# Patient Record
Sex: Female | Born: 1990
Health system: Southern US, Community
[De-identification: ages and names within clinical notes are randomized; demographics above are authoritative.]

---

## 2018-12-29 ENCOUNTER — Other Ambulatory Visit: Payer: Self-pay

## 2018-12-29 ENCOUNTER — Ambulatory Visit (INDEPENDENT_AMBULATORY_CARE_PROVIDER_SITE_OTHER): Payer: Managed Care, Other (non HMO)

## 2018-12-29 ENCOUNTER — Encounter: Payer: Self-pay | Admitting: Orthopedic Surgery

## 2018-12-29 ENCOUNTER — Ambulatory Visit (INDEPENDENT_AMBULATORY_CARE_PROVIDER_SITE_OTHER): Payer: Managed Care, Other (non HMO) | Admitting: Orthopedic Surgery

## 2018-12-29 DIAGNOSIS — M25561 Pain in right knee: Secondary | ICD-10-CM | POA: Diagnosis not present

## 2018-12-29 NOTE — Progress Notes (Signed)
Office Visit Note   Patient: Susan Valenzuela           Date of Birth: [birth year]           MRN: [id number] Visit Date: 12/29/2018 Requested by: No referring provider defined for this encounter. PCP: Patient, No Pcp Per  Subjective: Chief Complaint  Patient presents with  . Right Knee - Pain    HPI: Susan Valenzuela is a 28 year old patient with right knee pain.  She twisted it several weeks ago and has had pain since that time.  She initially had same type of pain about 5 years ago.  Work-up at that time was negative.  Therapy helped her.  At this time she reports significant pain and mechanical symptoms along with swelling in the right knee.  She has been taking ibuprofen and ice without relief.  It has been worsening over the last 2 weeks.  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  She does like to play tennis and do yoga.  It does ache when she is trying to sleep at night.  She is tried sleeping with a pillow between her legs without much relief.  Does have a history of right hip surgery which she did well with.              ROS: All systems reviewed are negative as they relate to the chief complaint within the history of present illness.  Patient denies  fevers or chills.   Assessment & Plan: Visit Diagnoses:  1. Right knee pain, unspecified chronicity     Plan: Impression is right knee twisting injury with possible MCL sprain versus medial meniscal tear.  She is fairly debilitated by the injury.  Plan MRI scan to evaluate for MCL strain versus medial meniscal tear.  Patient has a high level of fitness and excellent muscle tone and thus I do not think therapy is really indicated at this time based on these factors.  Patient did have an injury and is having mechanical and localizing pain.  I will see her back after that study  Follow-Up Instructions: Return for after MRI.   Orders:  Orders Placed This Encounter  Procedures  . XR KNEE 3 VIEW RIGHT  . MR Knee Right w/o contrast   No  orders of the defined types were placed in this encounter.     Procedures: No procedures performed   Clinical Data: No additional findings.  Objective: Vital Signs: There were no vitals taken for this visit.  Physical Exam:   Constitutional: Patient appears well-developed HEENT:  Head: Normocephalic Eyes:EOM are normal Neck: Normal range of motion Cardiovascular: Normal rate Pulmonary/chest: Effort normal Neurologic: Patient is alert Skin: Skin is warm Psychiatric: Patient has normal mood and affect    Ortho Exam: Ortho exam demonstrates full active and passive range of motion of that right knee.  Patient has medial joint line tenderness but negative patellar apprehension.  No groin pain with internal X rotation of the leg.  Excellent muscle tone in both legs.  Pedal pulses palpable.  No other masses lymphadenopathy or skin changes noted in that right knee region  Specialty Comments:  No specialty comments available.  Imaging: Xr Knee 3 View Right  Result Date: 12/29/2018 AP lateral right knee reviewed.  Alignment normal.  No arthritis is present.  No acute fracture.  Normal right knee    PMFS History: There are no active problems to display for this patient.  History reviewed. No pertinent past medical  history.  History reviewed. No pertinent family history.  History reviewed. No pertinent surgical history. Social History   Occupational History  . Not on file  Tobacco Use  . Smoking status: Not on file  Substance and Sexual Activity  . Alcohol use: Not on file  . Drug use: Not on file  . Sexual activity: Not on file

## 2019-01-07 ENCOUNTER — Encounter: Payer: Self-pay | Admitting: Orthopedic Surgery

## 2019-01-08 ENCOUNTER — Telehealth: Payer: Self-pay | Admitting: *Deleted

## 2019-01-08 NOTE — Telephone Encounter (Signed)
I called pt and let her know she may have been potentially exposed to an employee who later tested positive for the COVID-19 at [facility] during her recent visit.  She wants to think about if she wants to be tested or not.  I gave her the number to call back if she decides to have it done.   [phone].

## 2019-01-10 ENCOUNTER — Ambulatory Visit
Admission: RE | Admit: 2019-01-10 | Discharge: 2019-01-10 | Disposition: A | Discharge status: Home / Self Care | Payer: Managed Care, Other (non HMO) | Source: Ambulatory Visit | Attending: Orthopedic Surgery | Admitting: Orthopedic Surgery

## 2019-01-10 ENCOUNTER — Other Ambulatory Visit: Payer: Self-pay

## 2019-01-10 DIAGNOSIS — M25561 Pain in right knee: Secondary | ICD-10-CM

## 2019-01-13 NOTE — Telephone Encounter (Signed)
I called her.  She does not need follow-up.  She has essentially normal MRI scan of her knee.  We discussed putting an injection in the knee.  Nothing operative in the knee.  Could be referred pain from the hip.  No hip symptoms however.  I think she should come in if she wants potentially quicker resolution to her symptoms.  She will follow-up as needed.  Please cancel future appointments.  Thanks

## 2019-05-04 DIAGNOSIS — Z20828 Contact with and (suspected) exposure to other viral communicable diseases: Secondary | ICD-10-CM | POA: Diagnosis not present

## 2019-05-30 DIAGNOSIS — Z20828 Contact with and (suspected) exposure to other viral communicable diseases: Secondary | ICD-10-CM | POA: Diagnosis not present

## 2019-06-16 DIAGNOSIS — N898 Other specified noninflammatory disorders of vagina: Secondary | ICD-10-CM | POA: Diagnosis not present

## 2019-06-16 DIAGNOSIS — R102 Pelvic and perineal pain: Secondary | ICD-10-CM | POA: Diagnosis not present

## 2019-07-08 DIAGNOSIS — Z20828 Contact with and (suspected) exposure to other viral communicable diseases: Secondary | ICD-10-CM | POA: Diagnosis not present

## 2019-07-25 DIAGNOSIS — G8929 Other chronic pain: Secondary | ICD-10-CM | POA: Diagnosis not present

## 2019-07-25 DIAGNOSIS — J3089 Other allergic rhinitis: Secondary | ICD-10-CM | POA: Diagnosis not present

## 2019-07-25 DIAGNOSIS — R519 Headache, unspecified: Secondary | ICD-10-CM | POA: Diagnosis not present

## 2019-08-01 DIAGNOSIS — Z20828 Contact with and (suspected) exposure to other viral communicable diseases: Secondary | ICD-10-CM | POA: Diagnosis not present

## 2020-01-25 DIAGNOSIS — Z03818 Encounter for observation for suspected exposure to other biological agents ruled out: Secondary | ICD-10-CM | POA: Diagnosis not present

## 2020-01-25 DIAGNOSIS — Z20822 Contact with and (suspected) exposure to covid-19: Secondary | ICD-10-CM | POA: Diagnosis not present

## 2020-04-03 ENCOUNTER — Ambulatory Visit (INDEPENDENT_AMBULATORY_CARE_PROVIDER_SITE_OTHER): Payer: BC Managed Care – PPO | Admitting: Surgical

## 2020-04-03 ENCOUNTER — Encounter: Payer: Self-pay | Admitting: Surgical

## 2020-04-03 DIAGNOSIS — M7051 Other bursitis of knee, right knee: Secondary | ICD-10-CM | POA: Diagnosis not present

## 2020-04-03 DIAGNOSIS — M7050 Other bursitis of knee, unspecified knee: Secondary | ICD-10-CM

## 2020-04-03 MED ORDER — METHYLPREDNISOLONE ACETATE 40 MG/ML IJ SUSP
40.0000 mg | INTRAMUSCULAR | Status: AC | PRN
  Administered 2020-04-03: 40 mg via INTRA_ARTICULAR

## 2020-04-03 MED ORDER — BUPIVACAINE HCL 0.25 % IJ SOLN
4.0000 mL | INTRAMUSCULAR | Status: AC | PRN
  Administered 2020-04-03: 4 mL via INTRA_ARTICULAR

## 2020-04-03 MED ORDER — LIDOCAINE HCL 1 % IJ SOLN
5.0000 mL | INTRAMUSCULAR | Status: AC | PRN
  Administered 2020-04-03: 5 mL

## 2020-04-03 NOTE — Progress Notes (Signed)
Office Visit Note   Patient: Susan Valenzuela           Date of Birth: [birth year]           MRN: [id number] Visit Date: 04/03/2020 Requested by: No referring provider defined for this encounter. PCP: Patient, No Pcp Per  Subjective: Chief Complaint  Patient presents with  . Right Knee - Pain    HPI: Susan Valenzuela is a 29 y.o. female who presents to the office complaining of right knee pain.  Patient notes constant pain for the last year and on and off pain over the last 3 years.  She localizes pain to the medial aspect of her right knee.  She notes that pain is been progressively worsening.  She denies any recent injury.  She states that the right knee feels weak and like it wants to give way at times but denies any frank instability or episodes of locking.  She does note that it is difficult to get to sleep and pain wakes her up at night on occasion as it is difficult to sleep on her side and let her knees rest on top of each other.  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  She does note occasional groin pain but denies any significant low back pain.  She has a history of a prior right hip procedure that was done 8 years ago in [place] where she "had a torn ligament that was repaired and a bone spur that was shaved".  She denies any other surgeries.  She is not taking any medication for her symptoms aside from occasional ibuprofen that provides minimal relief.  She has no recent knee or hip injections.  For exercise she likes to play tennis and bicycle but occasionally she has to take days off due to the medial knee pain..                ROS: All systems reviewed are negative as they relate to the chief complaint within the history of present illness.  Patient denies fevers or chills.  Assessment & Plan: Visit Diagnoses:  1. Pes anserine bursitis     Plan: Patient is a 29 year old female who presents complaining of right knee pain.  She localizes pain to the medial aspect of  the right knee.  She has had a recent right knee MRI scan earlier this year that was negative for any identifiable pathology to cause her pain.  However today, she has small amount of swelling and maximal tenderness over the pes anserine bursa.  Impression is that this is the source of her pain.  Make sense with pain that is worse with pressing the knees together at this location when she is trying to sleep at night as well as tenderness on exam and pain with resisted hamstring strength testing.  Discussed options available to patient, she wishes to proceed with injection.  Under ultrasound guidance the hamstring tendons were identified and a cortisone injection was delivered deep to these tendons at the area of maximal tenderness.  After administration of the injection, patient states that she had less tenderness on exam as well as less pain with resisted hamstring exercises.  Plan for patient to follow-up in 4 weeks for clinical recheck.  Follow-Up Instructions: No follow-ups on file.   Orders:  No orders of the defined types were placed in this encounter.  No orders of the defined types were placed in this encounter.     Procedures: Large  Joint Inj: R knee (Pes anserine bursa) on 04/03/2020 7:05 PM Indications: diagnostic evaluation, joint swelling and pain Details: 18 G 1.5 in needle, ultrasound-guided  Arthrogram: No  Medications: 5 mL lidocaine 1 %; 40 mg methylPREDNISolone acetate 40 MG/ML; 4 mL bupivacaine 0.25 % Outcome: tolerated well, no immediate complications Procedure, treatment alternatives, risks and benefits explained, specific risks discussed. Consent was given by the patient. Immediately prior to procedure a time out was called to verify the correct patient, procedure, equipment, support staff and site/side marked as required. Patient was prepped and draped in the usual sterile fashion.       Clinical Data: No additional findings.  Objective: Vital Signs: There were no  vitals taken for this visit.  Physical Exam:  Constitutional: Patient appears well-developed HEENT:  Head: Normocephalic Eyes:EOM are normal Neck: Normal range of motion Cardiovascular: Normal rate Pulmonary/chest: Effort normal Neurologic: Patient is alert Skin: Skin is warm Psychiatric: Patient has normal mood and affect  Ortho Exam: Ortho exam demonstrates right knee with 0 degrees of extension and greater than 120 degrees of flexion.  No ligamentous laxity with valgus/varus stress or with Lachman exam.  Mild to moderate tenderness over the medial joint line.  No tenderness over the lateral joint line, patellar tendon, patella, quadriceps tendon.  Maximal tenderness overlying the pes anserine bursa with associated swelling that is increased compared with the contralateral side.  No symmetric tenderness on the left knee.  Pain is elicited at this location with resisted hamstring strength testing.  No significant pain elicited with hip range of motion.  No tenderness over the trochanteric bursa.  Negative Stinchfield exam.  Negative straight leg raise.  Specialty Comments:  No specialty comments available.  Imaging: No results found.   PMFS History: There are no problems to display for this patient.  No past medical history on file.  No family history on file.  No past surgical history on file. Social History   Occupational History  . Not on file  Tobacco Use  . Smoking status: Never Smoker  . Smokeless tobacco: Never Used  Substance and Sexual Activity  . Alcohol use: Not on file  . Drug use: Not on file  . Sexual activity: Not on file

## 2020-04-27 ENCOUNTER — Encounter: Payer: Self-pay | Admitting: Orthopedic Surgery

## 2020-04-27 NOTE — Telephone Encounter (Signed)
Keep appointment on 9/22, will determine next steps after talking to her and examining her on that date

## 2020-05-01 ENCOUNTER — Ambulatory Visit: Payer: BC Managed Care – PPO | Admitting: Orthopaedic Surgery

## 2020-05-01 DIAGNOSIS — Z20822 Contact with and (suspected) exposure to covid-19: Secondary | ICD-10-CM | POA: Diagnosis not present

## 2020-05-02 ENCOUNTER — Ambulatory Visit: Payer: Self-pay

## 2020-05-02 ENCOUNTER — Encounter: Payer: Self-pay | Admitting: Orthopedic Surgery

## 2020-05-02 ENCOUNTER — Ambulatory Visit (INDEPENDENT_AMBULATORY_CARE_PROVIDER_SITE_OTHER): Payer: BC Managed Care – PPO | Admitting: Orthopedic Surgery

## 2020-05-02 DIAGNOSIS — M79604 Pain in right leg: Secondary | ICD-10-CM

## 2020-05-02 DIAGNOSIS — M25561 Pain in right knee: Secondary | ICD-10-CM

## 2020-05-02 NOTE — Progress Notes (Signed)
Office Visit Note   Patient: Susan Valenzuela           Date of Birth: 02/18/1991           MRN: [id number] Visit Date: 05/02/2020 Requested by: No referring provider defined for this encounter. PCP: Patient, No Pcp Per  Subjective: Chief Complaint  Patient presents with  . Right Knee - Pain    HPI: Susan Valenzuela is a active and fit 29 year old female with right knee pain.  She had a pes bursa injection with Franky Macho 04/03/2020.  She obtained no relief from that and actually had 3 days of fairly severe pain.  Reports generally constant pain on the medial aspect of her knee.  The pain will wake her from sleep occasionally.  She is unable to exercise.  States her pain is actually worse with exercise.  She has not been able to play tennis or walk for exercise.  Does give her pain every day.  She also reports some pain in the right hip anterior superior iliac crest region.  Denies much in the way of groin pain.  Has had right hip surgery in the past.  Takes ibuprofen for symptoms.  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  She would like to hike but she cannot really hike.  Denies any mechanical symptoms in the hip or knee.  She has tried some exercises for the hip but with no relief in terms of stretching.  Denies any back pain.              ROS: All systems reviewed are negative as they relate to the chief complaint within the history of present illness.  Patient denies  fevers or chills.   Assessment & Plan: Visit Diagnoses:  1. Pain in right leg   2. Right knee pain, unspecified chronicity     Plan: Impression is right hip and knee pain.  The knee pain is slightly atypical.  Had normal MRI scan over a year ago.  Could be a stress reaction or stress fracture not visible on plain radiographs.  Intra-articular pathology possible but less likely.  She is not interested in any type of injection at this time which is understandable based on her reaction to the last injection.  I think she may have hip  pathology radiating to the knee or some other type of intrinsic knee pathology which could become more visible on advanced imaging as it is more symptomatic now.  Plan is for MRI right knee to evaluate stress fracture proximal medial tibia as well as MRI scan right hip to evaluate possible recurrent labral pathology in the hip with history of prior arthroscopy.  Follow-Up Instructions: Return for after MRI.   Orders:  Orders Placed This Encounter  Procedures  . XR HIP UNILAT W OR W/O PELVIS 2-3 VIEWS RIGHT  . MR Knee Right w/o contrast  . MR Hip Right w/o contrast   No orders of the defined types were placed in this encounter.     Procedures: No procedures performed   Clinical Data: No additional findings.  Objective: Vital Signs: There were no vitals taken for this visit.  Physical Exam:   Constitutional: Patient appears well-developed HEENT:  Head: Normocephalic Eyes:EOM are normal Neck: Normal range of motion Cardiovascular: Normal rate Pulmonary/chest: Effort normal Neurologic: Patient is alert Skin: Skin is warm Psychiatric: Patient has normal mood and affect    Ortho Exam: Ortho exam demonstrates normal gait alignment.  No Trendelenburg gait.  Some  pain in the anterior superior iliac crest region but no groin pain with internal extra rotation of the leg.  Not much in terms of mechanical symptoms in the hip with passive or active range of motion.  Right knee demonstrates both medial joint line tenderness as well as tenderness over the pes bursa tendons.  No lateral tenderness.  Range of motion is full.  Collateral and cruciate ligaments are stable.  No other masses lymphadenopathy or skin changes noted in the right knee region.  Specialty Comments:  No specialty comments available.  Imaging: XR HIP UNILAT W OR W/O PELVIS 2-3 VIEWS RIGHT  Result Date: 05/02/2020 AP pelvis lateral right hip reviewed.  No arthritis is present.  Joint is located.  No enthesopathic  changes around the trochanters.  Hips otherwise normal.  Bony pelvis normal.    PMFS History: There are no problems to display for this patient.  History reviewed. No pertinent past medical history.  History reviewed. No pertinent family history.  History reviewed. No pertinent surgical history. Social History   Occupational History  . Not on file  Tobacco Use  . Smoking status: Never Smoker  . Smokeless tobacco: Never Used  Substance and Sexual Activity  . Alcohol use: Not on file  . Drug use: Not on file  . Sexual activity: Not on file

## 2020-05-14 ENCOUNTER — Other Ambulatory Visit: Payer: Self-pay | Admitting: Orthopedic Surgery

## 2020-05-20 ENCOUNTER — Other Ambulatory Visit: Payer: BC Managed Care – PPO

## 2020-05-21 NOTE — Telephone Encounter (Signed)
Just the ones in the note thx

## 2020-05-23 ENCOUNTER — Ambulatory Visit: Payer: BC Managed Care – PPO | Admitting: Orthopedic Surgery

## 2020-06-04 DIAGNOSIS — J019 Acute sinusitis, unspecified: Secondary | ICD-10-CM | POA: Diagnosis not present

## 2020-06-04 DIAGNOSIS — R059 Cough, unspecified: Secondary | ICD-10-CM | POA: Diagnosis not present

## 2020-06-04 DIAGNOSIS — J22 Unspecified acute lower respiratory infection: Secondary | ICD-10-CM | POA: Diagnosis not present

## 2020-06-05 DIAGNOSIS — Z20822 Contact with and (suspected) exposure to covid-19: Secondary | ICD-10-CM | POA: Diagnosis not present

## 2020-06-06 NOTE — Telephone Encounter (Signed)
Patient has worsening of symptoms with physician directed physical therapy and rehabilitative exercises.  Please order MRI scans of the hip and knee as requested before.  Thank you

## 2020-06-14 DIAGNOSIS — R0789 Other chest pain: Secondary | ICD-10-CM | POA: Diagnosis not present

## 2020-06-14 DIAGNOSIS — R059 Cough, unspecified: Secondary | ICD-10-CM | POA: Diagnosis not present

## 2020-07-02 DIAGNOSIS — Z20822 Contact with and (suspected) exposure to covid-19: Secondary | ICD-10-CM | POA: Diagnosis not present

## 2020-07-03 IMAGING — MR MRI OF THE RIGHT KNEE WITHOUT CONTRAST
5 of 8 series · 19 of 40 positions shown · non-contrast
Comparison: MRI 10/17/2014

CLINICAL DATA: Twisting injury 4 weeks ago.  Persistent knee pain.

EXAM:
MRI OF THE RIGHT KNEE WITHOUT CONTRAST
TECHNIQUE: Multiplanar, multisequence MR imaging of the knee was performed. No
intravenous contrast was administered.

[Series 4: T2 fat-sat · axial · 4.0mm · 0.31mm/px · 1 of 25 slices shown]
[im 1/25]
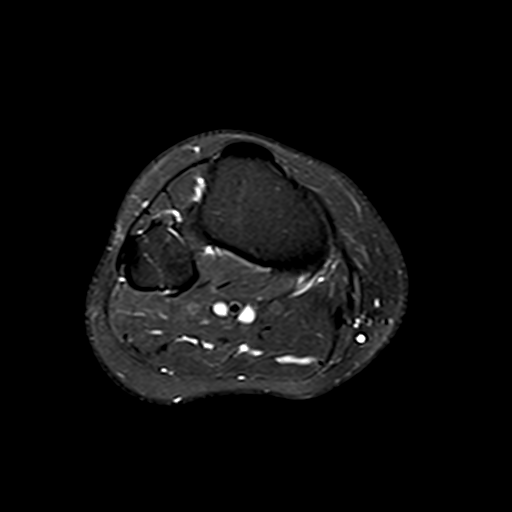

[Series 7: PD fat-sat · coronal · 4.0mm · 0.29mm/px · 5 of 24 slices shown (1 of 4)]
[im 1/24]
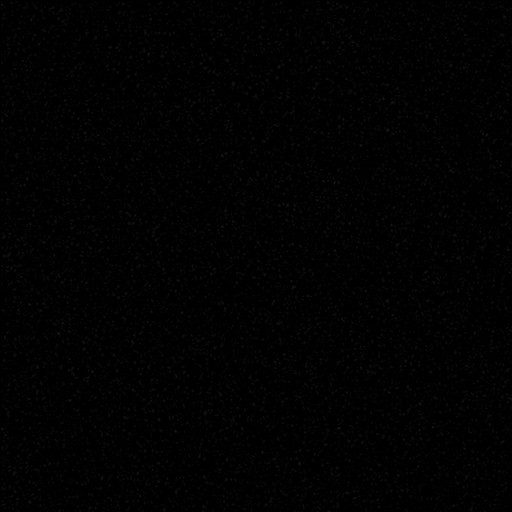
[im 6/24]
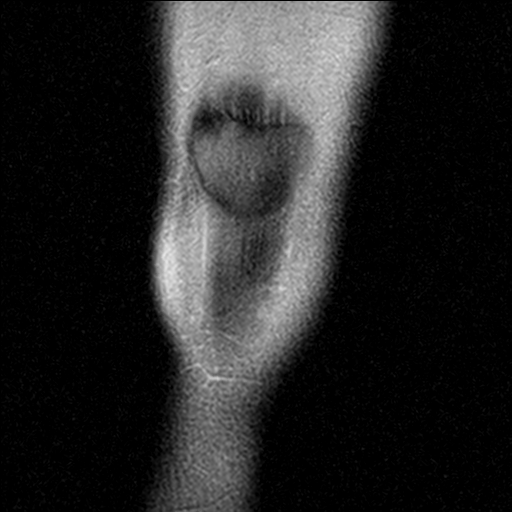
[im 12/24]
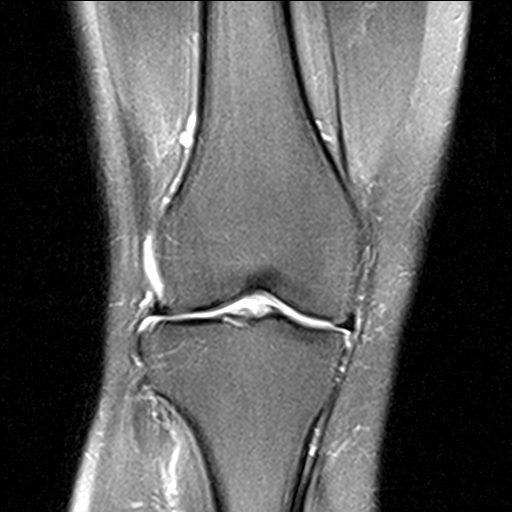
[im 18/24]
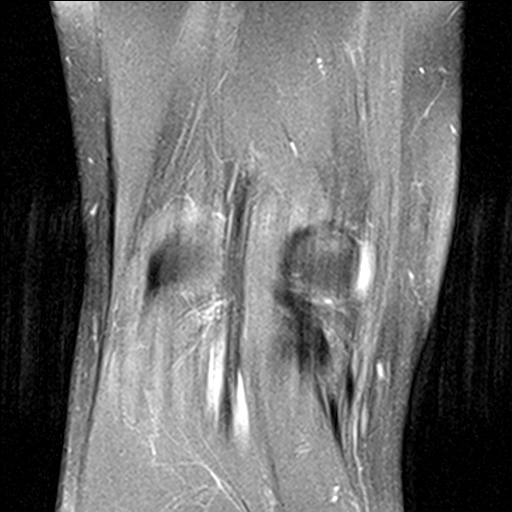
[im 24/24]
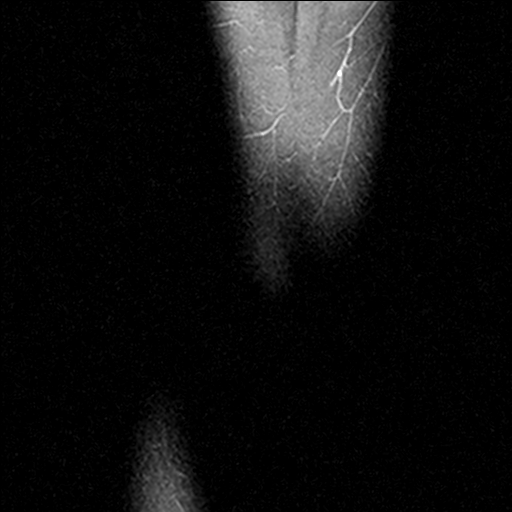

[Series 8: PD fat-sat · sagittal · 3.0mm · 0.29mm/px · 6 of 30 slices shown (2 of 4)]
[im 1/30]
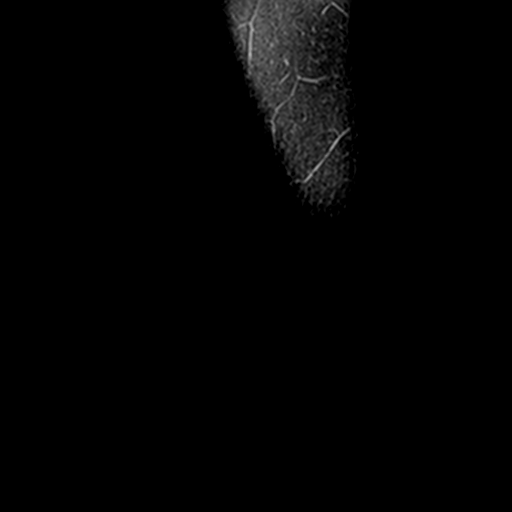
[im 6/30]
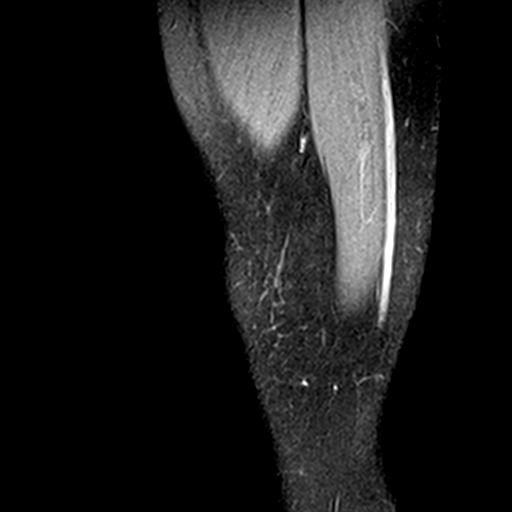
[im 12/30]
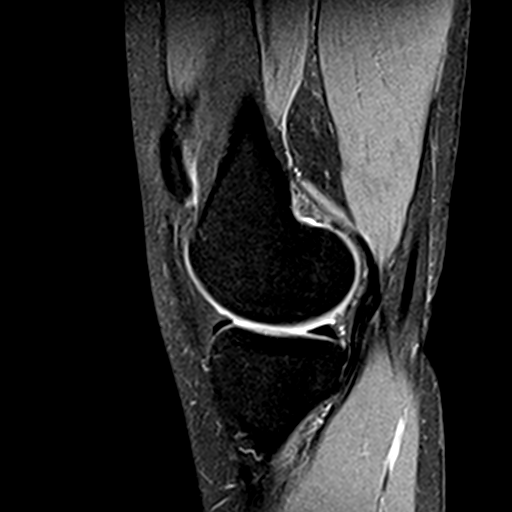
[im 18/30]
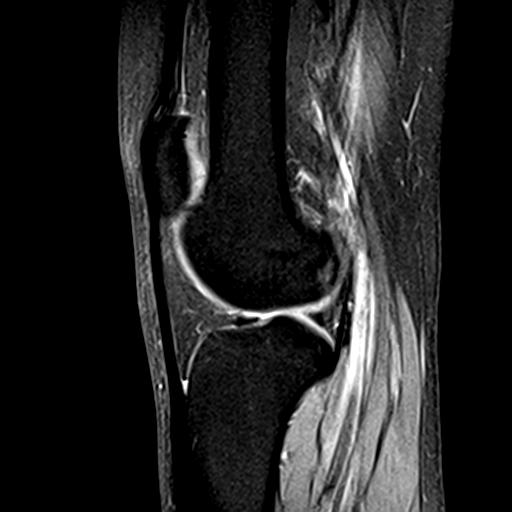
[im 24/30]
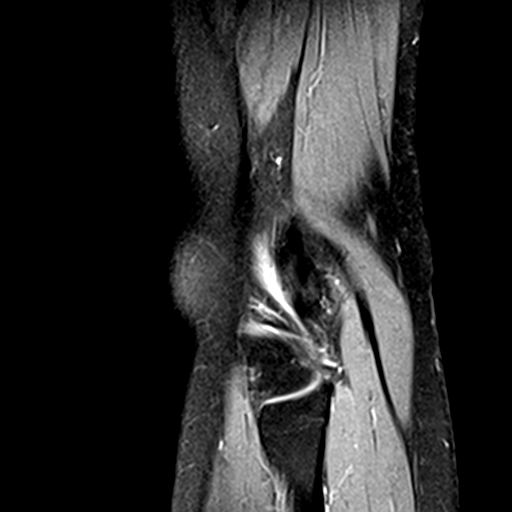
[im 30/30]
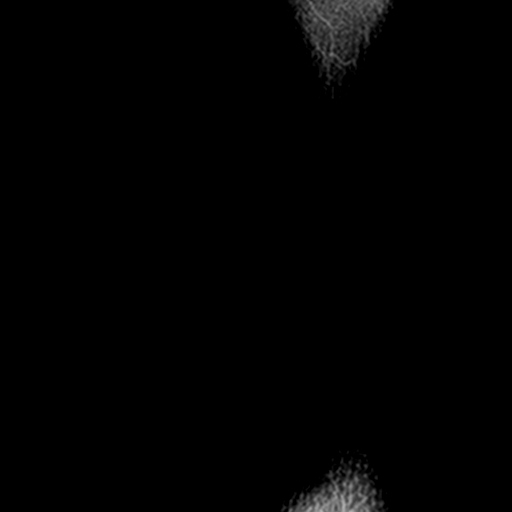

[Series 10: PD fat-sat · oblique · 2.0mm · 0.29mm/px · 2 of 11 slices shown (3 of 4)]
[im 1/11]
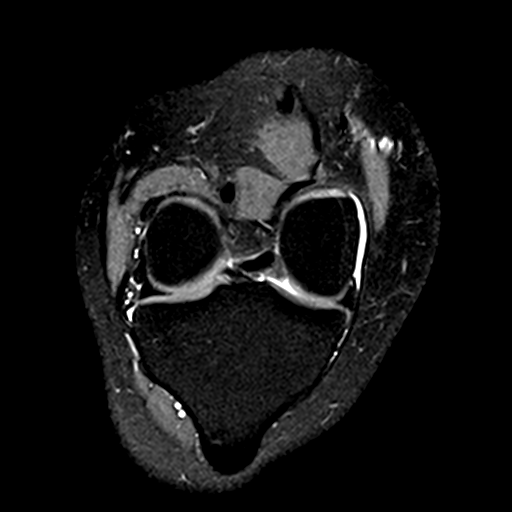
[im 11/11]
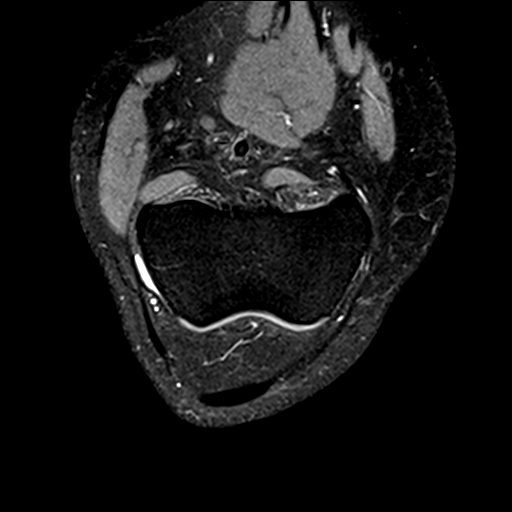

[Series 11: PD fat-sat · coronal · 4.0mm · 0.29mm/px · 5 of 24 slices shown (4 of 4)]
[im 1/24]
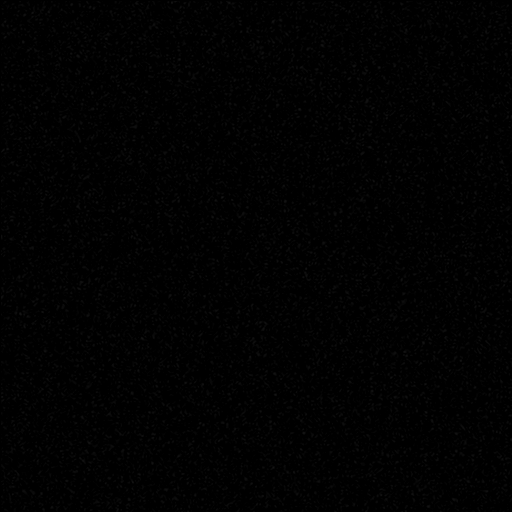
[im 6/24]
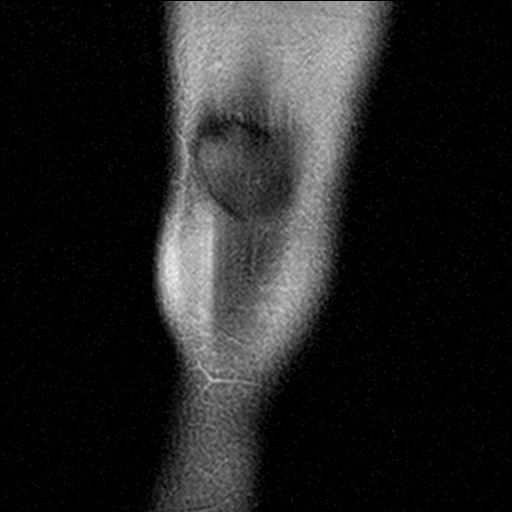
[im 12/24]
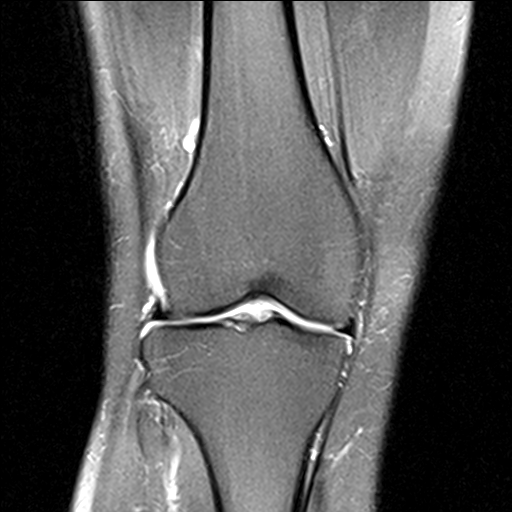
[im 18/24]
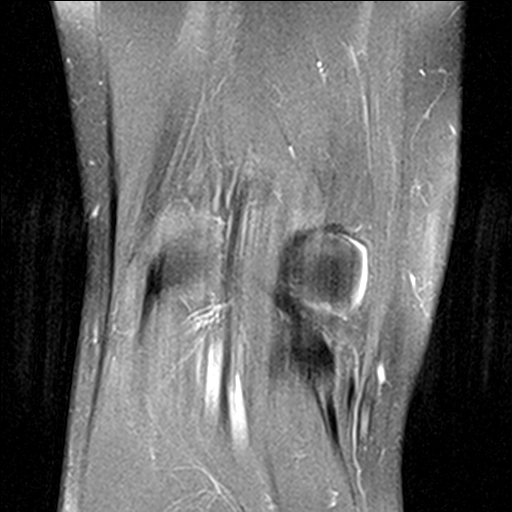
[im 24/24]
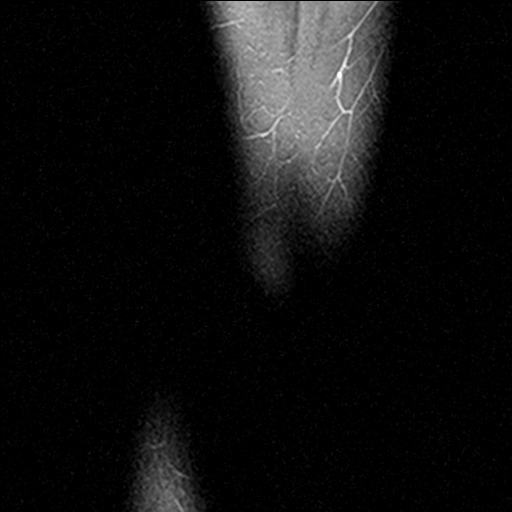

[19 of 40 positions shown; findings below may reference images not displayed]

FINDINGS: MENISCI

Medial meniscus:  Intact

Lateral meniscus:  Intact

LIGAMENTS

Cruciates:  Intact

Collaterals:  Intact

CARTILAGE

Patellofemoral: Mild degenerative chondrosis/chondromalacia. Patella
Alta is noted with elongated patellar tendon.

Medial:  Normal

Lateral:  Normal

Joint:  No joint effusion.

Popliteal Fossa:  No popliteal mass or Baker's cyst.

Extensor Mechanism: The patella retinacular structures are intact
and the quadriceps and patellar tendons are intact. Patella Alta
noted.

Bones: No acute bony findings. No bone contusion, marrow edema or
osteochondral lesion.

Other: Normal knee musculature.
IMPRESSION: 1. No acute bony findings and intact ligamentous structures.
2. No meniscal tears.
3. Mild chondromalacia patella. Patella Alta with elongated patellar
tendon noted.
4. No joint effusion or Baker's cyst.

## 2020-07-27 DIAGNOSIS — R197 Diarrhea, unspecified: Secondary | ICD-10-CM | POA: Diagnosis not present

## 2020-07-31 DIAGNOSIS — Z20822 Contact with and (suspected) exposure to covid-19: Secondary | ICD-10-CM | POA: Diagnosis not present

## 2020-08-19 ENCOUNTER — Other Ambulatory Visit: Payer: Self-pay

## 2020-08-19 ENCOUNTER — Ambulatory Visit
Admission: RE | Admit: 2020-08-19 | Discharge: 2020-08-19 | Disposition: A | Discharge status: Home / Self Care | Payer: BC Managed Care – PPO | Source: Ambulatory Visit | Attending: Orthopedic Surgery | Admitting: Orthopedic Surgery

## 2020-08-19 DIAGNOSIS — M25561 Pain in right knee: Secondary | ICD-10-CM

## 2020-08-19 DIAGNOSIS — M79604 Pain in right leg: Secondary | ICD-10-CM

## 2020-08-19 DIAGNOSIS — M25551 Pain in right hip: Secondary | ICD-10-CM | POA: Diagnosis not present

## 2020-08-20 NOTE — Progress Notes (Signed)
Hi Lauren can you have this person follow-up with me sometime thanks

## 2020-08-24 ENCOUNTER — Ambulatory Visit: Payer: BC Managed Care – PPO | Admitting: Surgical

## 2020-08-29 ENCOUNTER — Other Ambulatory Visit: Payer: Self-pay

## 2020-08-29 ENCOUNTER — Ambulatory Visit (INDEPENDENT_AMBULATORY_CARE_PROVIDER_SITE_OTHER): Payer: BC Managed Care – PPO | Admitting: Orthopedic Surgery

## 2020-08-29 DIAGNOSIS — M79604 Pain in right leg: Secondary | ICD-10-CM

## 2020-08-29 DIAGNOSIS — M25561 Pain in right knee: Secondary | ICD-10-CM

## 2020-09-01 ENCOUNTER — Encounter: Payer: Self-pay | Admitting: Orthopedic Surgery

## 2020-09-01 NOTE — Progress Notes (Signed)
Office Visit Note   Patient: Susan Valenzuela           Date of Birth: Aug 06, 1991           MRN: [id number] Visit Date: 08/29/2020 Requested by: No referring provider defined for this encounter. PCP: Kerin Salen, PA-C  Subjective: Chief Complaint  Patient presents with   Other    Scan review    HPI: Susan Valenzuela is a 30 year old patient with right hip and right knee pain.  Since have seen her she has had MRI scans of both.  The right hip may have recurrent small anterior superior labral tear.  She has had prior surgery on the right hip.  Right knee exam looks pretty benign in terms of macroscopic structural damage to the knee.  Reports medial sided knee pain in the right knee.  On the hip she localizes most of her symptoms to the anterior superior iliac crest region.  Hard for her to work out.  Hard for her to walk in heels.  She has to use flats to walk any type of distance.  Not taking much in terms of medication.  She is moving to [facility] in 2 weeks.              ROS: All systems reviewed are negative as they relate to the chief complaint within the history of present illness.  Patient denies  fevers or chills.   Assessment & Plan: Visit Diagnoses:  1. Right knee pain, unspecified chronicity   2. Pain in right leg     Plan: Impression is right hip and knee pain.  Nothing definitively operative in the right knee.  Its been a year since her last scan.  The right hip may or may not have operative pathology present.  I would recommend intra-articular hip injection before any type of intervention.  From a structural standpoint I do not think there is a big contraindication to any type of activity that she can perform while letting pain be her guide to how much she does.  She did get a copy of the scans to take with her  to [place].  I also wrote her prescription for therapy that she can initiate up there.  Follow-up with [place] as needed  Follow-Up Instructions: Return if  symptoms worsen or fail to improve.   Orders:  No orders of the defined types were placed in this encounter.  No orders of the defined types were placed in this encounter.     Procedures: No procedures performed   Clinical Data: No additional findings.  Objective: Vital Signs: There were no vitals taken for this visit.  Physical Exam:   Constitutional: Patient appears well-developed HEENT:  Head: Normocephalic Eyes:EOM are normal Neck: Normal range of motion Cardiovascular: Normal rate Pulmonary/chest: Effort normal Neurologic: Patient is alert Skin: Skin is warm Psychiatric: Patient has normal mood and affect    Ortho Exam: Ortho exam demonstrates normal gait and alignment.  No real hip popping or clicking.  She has good ankle dorsiflexion plantarflexion quad hamstring strength.  Not much groin pain with internal ex rotation of the leg.  Does have a little bit of tenderness to palpation just below the anterior superior iliac crest region.  Also has some mild tenderness over the pes bursa tendons.  She has had 1 injection here in the past which did not give her any relief and caused her some pain.  Specialty Comments:  No specialty comments available.  Imaging: No results found.   PMFS History: There are no problems to display for this patient.  History reviewed. No pertinent past medical history.  History reviewed. No pertinent family history.  History reviewed. No pertinent surgical history. Social History   Occupational History   Not on file  Tobacco Use   Smoking status: Never Smoker   Smokeless tobacco: Never Used  Substance and Sexual Activity   Alcohol use: Not on file   Drug use: Not on file   Sexual activity: Not on file
